# Patient Record
Sex: Female | Born: 1995 | Race: Black or African American | Hispanic: No | Marital: Single | Smoking: Current every day smoker
Health system: Southern US, Community
[De-identification: ages and names within clinical notes are randomized; demographics above are authoritative.]

---
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
## 2017-08-07 ENCOUNTER — Other Ambulatory Visit: Payer: Self-pay

## 2017-08-07 ENCOUNTER — Encounter: Payer: Self-pay | Admitting: Emergency Medicine

## 2017-08-07 ENCOUNTER — Emergency Department: Payer: Medicaid Other

## 2017-08-07 DIAGNOSIS — Y999 Unspecified external cause status: Secondary | ICD-10-CM | POA: Diagnosis not present

## 2017-08-07 DIAGNOSIS — S83004A Unspecified dislocation of right patella, initial encounter: Secondary | ICD-10-CM | POA: Insufficient documentation

## 2017-08-07 DIAGNOSIS — Y929 Unspecified place or not applicable: Secondary | ICD-10-CM | POA: Insufficient documentation

## 2017-08-07 DIAGNOSIS — X58XXXA Exposure to other specified factors, initial encounter: Secondary | ICD-10-CM | POA: Insufficient documentation

## 2017-08-07 DIAGNOSIS — Z79899 Other long term (current) drug therapy: Secondary | ICD-10-CM | POA: Diagnosis not present

## 2017-08-07 DIAGNOSIS — F17210 Nicotine dependence, cigarettes, uncomplicated: Secondary | ICD-10-CM | POA: Insufficient documentation

## 2017-08-07 DIAGNOSIS — Y9383 Activity, rough housing and horseplay: Secondary | ICD-10-CM | POA: Diagnosis not present

## 2017-08-07 DIAGNOSIS — S8991XA Unspecified injury of right lower leg, initial encounter: Secondary | ICD-10-CM | POA: Diagnosis present

## 2017-08-07 NOTE — ED Triage Notes (Signed)
Pt to triage via w/c, tearful; reports PTA had fallen on her right knee while horseplaying; c/o persistent pain radiating down leg; st felt is "pop out and back in"

## 2017-08-08 ENCOUNTER — Emergency Department
Admission: EM | Admit: 2017-08-08 | Discharge: 2017-08-08 | Disposition: A | Discharge status: Home / Self Care | Payer: Medicaid Other | Attending: Emergency Medicine | Admitting: Emergency Medicine

## 2017-08-08 DIAGNOSIS — S83004A Unspecified dislocation of right patella, initial encounter: Secondary | ICD-10-CM

## 2017-08-08 MED ORDER — HYDROCODONE-ACETAMINOPHEN 5-325 MG PO TABS
2.0000 | ORAL_TABLET | Freq: Once | ORAL | Status: AC
  Administered 2017-08-08: 2 via ORAL
  Filled 2017-08-08: qty 2

## 2017-08-08 MED ORDER — IBUPROFEN 600 MG PO TABS: 600.0000 mg | ORAL_TABLET | Freq: Three times a day (TID) | ORAL | 0 refills | Status: DC | PRN

## 2017-08-08 MED ORDER — HYDROCODONE-ACETAMINOPHEN 5-325 MG PO TABS: 1.0000 | ORAL_TABLET | Freq: Four times a day (QID) | ORAL | 0 refills | Status: DC | PRN

## 2017-08-08 MED ORDER — IBUPROFEN 600 MG PO TABS
600.0000 mg | ORAL_TABLET | Freq: Once | ORAL | Status: AC
  Administered 2017-08-08: 600 mg via ORAL
  Filled 2017-08-08: qty 1

## 2017-08-08 NOTE — Discharge Instructions (Signed)
Please wear your knee immobilizer at all times until you can follow-up with the orthopedic surgeon this coming week for recheck.  Return to the emergency department sooner for any concerns whatsoever.  It was a pleasure to take care of you today, and thank you for coming to our emergency department.  If you have any questions or concerns before leaving please ask the nurse to grab me and I'm more than happy to go through your aftercare instructions again.  If you were prescribed any opioid pain medication today such as Norco, Vicodin, Percocet, morphine, hydrocodone, or oxycodone please make sure you do not drive when you are taking this medication as it can alter your ability to drive safely.  If you have any concerns once you are home that you are not improving or are in fact getting worse before you can make it to your follow-up appointment, please do not hesitate to call 911 and come back for further evaluation.  Merrily Brittle, MD  Results for orders placed or performed during the hospital encounter of 04/06/16  Influenza panel by PCR (type A & B, H1N1)  Result Value Ref Range   Influenza A By PCR NEGATIVE NEGATIVE   Influenza B By PCR POSITIVE (A) NEGATIVE   Dg Knee Complete 4 Views Right  Result Date: 08/07/2017 CLINICAL DATA:  Patient fell on right knee while horse playing. Pain radiates down leg. Patient states that her knee feels like it pops in and out. EXAM: RIGHT KNEE - COMPLETE 4+ VIEW COMPARISON:  None. FINDINGS: Four views of the right knee were provided. On some of the views, there is slight lateral subluxation of the patella which could be due to a shallow trochlear groove which may predispose the patient patellar dislocation. No fracture nor joint effusion. No intra-articular loose bodies. IMPRESSION: Slight lateral subluxation of the patella on some of the views provided. This could be due to a shallow trochlear groove that may predispose the patient to patellar dislocation.  Currently there is no joint dislocation. No fracture nor joint effusion is seen. Nonemergent CT or MRI may help in the workup of this possibility. Electronically Signed   By: Tollie Eth M.D.   On: 08/07/2017 23:52   Unfortunately prescriptions medications can be very expensive.  Please consider [facility] as they have a number of medications that are $4 for a 30 day supply.  [email or web address].[email or web address]  Another great option is www.goodrx.com which can help you find the most affordable prices around you.

## 2017-08-08 NOTE — ED Notes (Signed)

## 2017-08-08 NOTE — ED Provider Notes (Signed)
[facility] Emergency Department Provider Note  ____________________________________________   First MD Initiated Contact with Patient 08/08/17 [phone]     (approximate)  I have reviewed the triage vital signs and the nursing notes.   HISTORY  Chief Complaint Knee Injury    HPI Miranda Lewis is a 22 y.o. female who self presents to the emergency department with right knee pain.  She said she was wrestling with her brother earlier today and she felt her knee "pop and then on pop".  She had sudden onset severe pain that is persisted.  She has had difficulty ambulating.  This is never happened before.  She denies numbness or weakness.  History reviewed. No pertinent past medical history.  There are no active problems to display for this patient.   History reviewed. No pertinent surgical history.  Prior to Admission medications   Medication Sig Start Date End Date Taking? Authorizing Provider  hydrOXYzine (ATARAX/VISTARIL) 10 MG tablet Take 1 tablet (10 mg total) by mouth every 6 (six) hours as needed for itching. 02/06/15   Tommi Rumps, PA-C  mupirocin ointment (BACTROBAN) 2 % Apply 1 application topically 2 (two) times daily. 10/02/15   Hagler, Jami L, PA-C  sulfamethoxazole-trimethoprim (BACTRIM DS,SEPTRA DS) 800-160 MG tablet Take 1 tablet by mouth 2 (two) times daily. 10/02/15   Hagler, Jami L, PA-C  Triamcinolone Acetonide (TRIAMCINOLONE 0.1 % CREAM : EUCERIN) CREA Apply 1 application topically 2 (two) times daily. 02/06/15   Tommi Rumps, PA-C    Allergies Patient has no known allergies.  No family history on file.  Social History Social History   Tobacco Use  . Smoking status: Current Every Day Smoker    Packs/day: 0.50    Types: Cigarettes  . Smokeless tobacco: Never Used  Substance Use Topics  . Alcohol use: No  . Drug use: No    Review of Systems Constitutional: No fever/chills ENT: No sore throat. Cardiovascular:  Denies chest pain. Respiratory: Denies shortness of breath. Gastrointestinal: No abdominal pain.  No nausea, no vomiting.  No diarrhea.  No constipation. Musculoskeletal: Positive for knee pain Neurological: Negative for headaches   ____________________________________________   PHYSICAL EXAM:  VITAL SIGNS: ED Triage Vitals  Enc Vitals Group     BP 08/07/17 2349 119/62     Pulse Rate 08/07/17 2349 94     Resp 08/07/17 2349 18     Temp 08/07/17 2349 98.4 F (36.9 C)     Temp Source 08/07/17 2349 Oral     SpO2 08/07/17 2349 100 %     Weight 08/07/17 2329 230 lb (104.3 kg)     Height 08/07/17 2329  (1.626 m)     Head Circumference --      Peak Flow --      Pain Score 08/07/17 2329 6     Pain Loc --      Pain Edu? --      Excl. in GC? --     Constitutional: Alert and oriented x4 appears obviously uncomfortable nontoxic no diaphoresis speaks in clear sentences Head: Atraumatic. Nose: No congestion/rhinnorhea. Mouth/Throat: No trismus Neck: No stridor.   Cardiovascular: Regular rate and rhythm Respiratory: Normal respiratory effort.  No retractions. MSK: Right knee stable extensor mechanism intact no effusion is somewhat tender patella is in place Neurologic:  Normal speech and language. No gross focal neurologic deficits are appreciated.  Skin:  Skin is warm, dry and intact. No rash noted.    ____________________________________________  LABS (all labs ordered are listed, but only abnormal results are displayed)  Labs Reviewed - No data to display   __________________________________________  EKG   ____________________________________________  RADIOLOGY  X-ray reviewed by me consistent with right patella subluxation ____________________________________________   DIFFERENTIAL includes but not limited to  Patella dislocation, knee dislocation, tibial plateau fracture   PROCEDURES  Procedure(s) performed: yes  SPLINT APPLICATION Authorized by:  Merrily Brittle Consent: Verbal consent obtained. Risks and benefits: risks, benefits and alternatives were discussed Consent given by: patient Splint applied by: ER nurse Location details: Right knee Splint type: Knee immobilizer Supplies used: Knee immobilizer Post-procedure: The splinted body part was neurovascularly unchanged following the procedure. Patient tolerance: Patient tolerated the procedure well with no immediate complications.     Procedures  Critical Care performed: no  Observation: no ____________________________________________   INITIAL IMPRESSION / ASSESSMENT AND PLAN / ED COURSE  Pertinent labs & imaging results that were available during my care of the patient were reviewed by me and considered in my medical decision making (see chart for details).  ----------------------------------------- 2:16 AM on 08/08/2017 -----------------------------------------  By the time I saw the patient she had already self reduced her dislocation.  Her x-rays negative for fracture dislocation at this time.  Placed in a knee immobilizer and is neurovascularly intact thereafter.  She will be given crutches and is toe-touch weightbearing.  Ortho referral.  Strict return precautions were given to the patient verbally understands and agrees with the plan.      ____________________________________________   FINAL CLINICAL IMPRESSION(S) / ED DIAGNOSES  Final diagnoses:  None      NEW MEDICATIONS STARTED DURING THIS VISIT:  New Prescriptions   No medications on file     Note:  This document was prepared using Dragon voice recognition software and may include unintentional dictation errors.      Merrily Brittle, MD 08/09/17 [phone]

## 2018-12-12 ENCOUNTER — Encounter: Payer: Self-pay | Admitting: Emergency Medicine

## 2018-12-12 ENCOUNTER — Emergency Department
Admission: EM | Admit: 2018-12-12 | Discharge: 2018-12-12 | Disposition: A | Discharge status: Home / Self Care | Payer: Medicaid Other | Attending: Emergency Medicine | Admitting: Emergency Medicine

## 2018-12-12 ENCOUNTER — Other Ambulatory Visit: Payer: Self-pay

## 2018-12-12 DIAGNOSIS — N3001 Acute cystitis with hematuria: Secondary | ICD-10-CM | POA: Insufficient documentation

## 2018-12-12 DIAGNOSIS — R110 Nausea: Secondary | ICD-10-CM

## 2018-12-12 DIAGNOSIS — Z79899 Other long term (current) drug therapy: Secondary | ICD-10-CM | POA: Insufficient documentation

## 2018-12-12 DIAGNOSIS — F17210 Nicotine dependence, cigarettes, uncomplicated: Secondary | ICD-10-CM | POA: Insufficient documentation

## 2018-12-12 LAB — LIPASE, BLOOD: Lipase: 22 U/L (ref 11–51)

## 2018-12-12 LAB — URINALYSIS, COMPLETE (UACMP) WITH MICROSCOPIC
Bilirubin Urine: NEGATIVE
Glucose, UA: NEGATIVE mg/dL
Ketones, ur: NEGATIVE mg/dL
Nitrite: NEGATIVE
Protein, ur: 30 mg/dL — AB
Specific Gravity, Urine: 1.028 (ref 1.005–1.030)
pH: 6 (ref 5.0–8.0)

## 2018-12-12 LAB — CBC
HCT: 35.2 % — ABNORMAL LOW (ref 36.0–46.0)
Hemoglobin: 10.8 g/dL — ABNORMAL LOW (ref 12.0–15.0)
MCH: 21.1 pg — ABNORMAL LOW (ref 26.0–34.0)
MCHC: 30.7 g/dL (ref 30.0–36.0)
MCV: 68.8 fL — ABNORMAL LOW (ref 80.0–100.0)
Platelets: 363 10*3/uL (ref 150–400)
RBC: 5.12 MIL/uL — ABNORMAL HIGH (ref 3.87–5.11)
RDW: 19.5 % — ABNORMAL HIGH (ref 11.5–15.5)
WBC: 13.5 10*3/uL — ABNORMAL HIGH (ref 4.0–10.5)
nRBC: 0 % (ref 0.0–0.2)

## 2018-12-12 LAB — COMPREHENSIVE METABOLIC PANEL
ALT: 15 U/L (ref 0–44)
AST: 41 U/L (ref 15–41)
Albumin: 4.3 g/dL (ref 3.5–5.0)
Alkaline Phosphatase: 89 U/L (ref 38–126)
Anion gap: 11 (ref 5–15)
BUN: 13 mg/dL (ref 6–20)
CO2: 25 mmol/L (ref 22–32)
Calcium: 9.7 mg/dL (ref 8.9–10.3)
Chloride: 104 mmol/L (ref 98–111)
Creatinine, Ser: 1.2 mg/dL — ABNORMAL HIGH (ref 0.44–1.00)
GFR calc Af Amer: >60 mL/min (ref >60)
GFR calc non Af Amer: >60 mL/min (ref >60)
Glucose, Bld: 81 mg/dL (ref 70–99)
Potassium: 3.7 mmol/L (ref 3.5–5.1)
Sodium: 140 mmol/L (ref 135–145)
Total Bilirubin: 0.3 mg/dL (ref 0.3–1.2)
Total Protein: 8 g/dL (ref 6.5–8.1)

## 2018-12-12 LAB — POCT PREGNANCY, URINE: Preg Test, Ur: NEGATIVE

## 2018-12-12 MED ORDER — SODIUM CHLORIDE 0.9 % IV SOLN
1.0000 g | Freq: Once | INTRAVENOUS | Status: AC
  Administered 2018-12-12: 21:00:00 1 g via INTRAVENOUS
  Filled 2018-12-12: qty 10

## 2018-12-12 MED ORDER — ONDANSETRON 4 MG PO TBDP
4.0000 mg | ORAL_TABLET | Freq: Once | ORAL | Status: AC
Start: 2018-12-12 — End: 2018-12-12
  Administered 2018-12-12: 21:00:00 4 mg via ORAL
  Filled 2018-12-12: qty 1

## 2018-12-12 MED ORDER — SODIUM CHLORIDE 0.9% FLUSH
3.0000 mL | Freq: Once | INTRAVENOUS | Status: AC
  Administered 2018-12-12: 22:00:00 3 mL via INTRAVENOUS

## 2018-12-12 MED ORDER — ONDANSETRON HCL 4 MG PO TABS: 4.0000 mg | ORAL_TABLET | Freq: Every day | ORAL | 0 refills | Status: AC | PRN

## 2018-12-12 MED ORDER — CEPHALEXIN 500 MG PO CAPS: 500.0000 mg | ORAL_CAPSULE | Freq: Three times a day (TID) | ORAL | 0 refills | Status: AC

## 2018-12-12 MED ORDER — CEFTRIAXONE SODIUM 1 G IJ SOLR
1.0000 g | Freq: Once | INTRAMUSCULAR | Status: DC
  Filled 2018-12-12: qty 10

## 2018-12-12 MED ORDER — SODIUM CHLORIDE 0.9 % IV BOLUS
1000.0000 mL | Freq: Once | INTRAVENOUS | Status: AC
Start: 2018-12-12 — End: 2018-12-12
  Administered 2018-12-12: 1000 mL via INTRAVENOUS

## 2018-12-12 NOTE — Discharge Instructions (Signed)
Your urine shows a developing urinary tract infection.  Please return to the emergency department if you develop fever, abdominal pain, worsening symptoms, any other symptoms concerning to you.  Please follow-up with primary care early next week.

## 2018-12-12 NOTE — ED Notes (Signed)
Patient states that she vomited times one this morning. Patient states that she continues to have waves of nausea throughout the day.

## 2018-12-12 NOTE — ED Notes (Signed)
Patient given a warm blanket at this time.  

## 2018-12-12 NOTE — ED Triage Notes (Signed)
Pt to ER with c/o nausea and vomiting that started this AM.  States only vomited early this AM, but nausea has continued throughout the day.  Pt denies abdominal pain, cough, congestion or other s/s.

## 2018-12-12 NOTE — ED Provider Notes (Signed)
[facility] Emergency Department Provider Note  ____________________________________________  Time seen: Approximately 8:27 PM  I have reviewed the triage vital signs and the nursing notes.   HISTORY  Chief Complaint Emesis and Nausea    HPI Miranda Lewis is a 23 y.o. female that presents to the emergency department for evaluation of nausea today.  Patient states that she felt nauseous this morning and proceeded to vomit once.  Patient noticed some blood in her urine today.  She should also be starting her menstrual cycle within the next couple of days.  She has been drinking [employer] today.  No recent illness. She feels well overall.  No fever or chills.  No congestion, cough, SOB, CP, abdominal pain, diarrhea, constipation.   History reviewed. No pertinent past medical history.  There are no active problems to display for this patient.   History reviewed. No pertinent surgical history.  Prior to Admission medications   Medication Sig Start Date End Date Taking? Authorizing Provider  cephALEXin (KEFLEX) 500 MG capsule Take 1 capsule (500 mg total) by mouth 3 (three) times daily for 7 days. 12/12/18 12/19/18  Laban Emperor, PA-C  HYDROcodone-acetaminophen (NORCO) 5-325 MG tablet Take 1 tablet by mouth every 6 (six) hours as needed for up to 7 doses for severe pain. 08/08/17   Darel Hong, MD  hydrOXYzine (ATARAX/VISTARIL) 10 MG tablet Take 1 tablet (10 mg total) by mouth every 6 (six) hours as needed for itching. 02/06/15   Johnn Hai, PA-C  ibuprofen (ADVIL,MOTRIN) 600 MG tablet Take 1 tablet (600 mg total) by mouth every 8 (eight) hours as needed. 08/08/17   Darel Hong, MD  mupirocin ointment (BACTROBAN) 2 % Apply 1 application topically 2 (two) times daily. 10/02/15   Hagler, Jami L, PA-C  ondansetron (ZOFRAN) 4 MG tablet Take 1 tablet (4 mg total) by mouth daily as needed for nausea or vomiting. 12/12/18 12/12/19  Laban Emperor, PA-C   sulfamethoxazole-trimethoprim (BACTRIM DS,SEPTRA DS) 800-160 MG tablet Take 1 tablet by mouth 2 (two) times daily. 10/02/15   Hagler, Jami L, PA-C  Triamcinolone Acetonide (TRIAMCINOLONE 0.1 % CREAM : EUCERIN) CREA Apply 1 application topically 2 (two) times daily. 02/06/15   Johnn Hai, PA-C    Allergies Patient has no known allergies.  No family history on file.  Social History Social History   Tobacco Use  . Smoking status: Current Every Day Smoker    Packs/day: 0.50    Types: Cigarettes  . Smokeless tobacco: Never Used  Substance Use Topics  . Alcohol use: No  . Drug use: No     Review of Systems  Constitutional: No fever/chills ENT: No upper respiratory complaints. Cardiovascular: No chest pain. Respiratory: No cough. No SOB. Gastrointestinal: No abdominal pain.  No nausea, no vomiting.  Genitourinary: Negative for dysuria, urgency, frequency. Musculoskeletal: Negative for musculoskeletal pain. Skin: Negative for rash, abrasions, lacerations, ecchymosis. Neurological: Negative for headaches, numbness or tingling   ____________________________________________   PHYSICAL EXAM:  VITAL SIGNS: ED Triage Vitals  Enc Vitals Group     BP 12/12/18 1805 (!) 105/57     Pulse Rate 12/12/18 1801 86     Resp 12/12/18 1801 18     Temp 12/12/18 1801 99.3 F (37.4 C)     Temp Source 12/12/18 1801 Oral     SpO2 12/12/18 1801 98 %     Weight 12/12/18 1801 231 lb (104.8 kg)     Height 12/12/18 1801 5\' 5"  (1.651 m)  Head Circumference --      Peak Flow --      Pain Score 12/12/18 1801 0     Pain Loc --      Pain Edu? --      Excl. in GC? --      Constitutional: Alert and oriented. Well appearing and in no acute distress. Eyes: Conjunctivae are normal. PERRL. EOMI. Head: Atraumatic. ENT:      Ears:      Nose: No congestion/rhinnorhea.      Mouth/Throat: Mucous membranes are moist.  Neck: No stridor.   Cardiovascular: Normal rate, regular rhythm.  Good  peripheral circulation. Respiratory: Normal respiratory effort without tachypnea or retractions. Lungs CTAB. Good air entry to the bases with no decreased or absent breath sounds. Gastrointestinal: Bowel sounds 4 quadrants. Soft and nontender to palpation. No guarding or rigidity. No palpable masses. No distention.  Musculoskeletal: Full range of motion to all extremities. No gross deformities appreciated. Neurologic:  Normal speech and language. No gross focal neurologic deficits are appreciated.  Skin:  Skin is warm, dry and intact. No rash noted. Psychiatric: Mood and affect are normal. Speech and behavior are normal. Patient exhibits appropriate insight and judgement.   ____________________________________________   LABS (all labs ordered are listed, but only abnormal results are displayed)  Labs Reviewed  COMPREHENSIVE METABOLIC PANEL - Abnormal; Notable for the following components:      Result Value   Creatinine, Ser 1.20 (*)    All other components within normal limits  CBC - Abnormal; Notable for the following components:   WBC 13.5 (*)    RBC 5.12 (*)    Hemoglobin 10.8 (*)    HCT 35.2 (*)    MCV 68.8 (*)    MCH 21.1 (*)    RDW 19.5 (*)    All other components within normal limits  URINALYSIS, COMPLETE (UACMP) WITH MICROSCOPIC - Abnormal; Notable for the following components:   Color, Urine YELLOW (*)    APPearance CLOUDY (*)    Hgb urine dipstick LARGE (*)    Protein, ur 30 (*)    Leukocytes,Ua SMALL (*)    Bacteria, UA RARE (*)    All other components within normal limits  URINE CULTURE  LIPASE, BLOOD  POC URINE PREG, ED  POCT PREGNANCY, URINE   ____________________________________________  EKG   ____________________________________________  RADIOLOGY   No results found.  ____________________________________________    PROCEDURES  Procedure(s) performed:    Procedures    Medications  sodium chloride flush (NS) 0.9 % injection 3 mL (3 mLs  Intravenous Given 12/12/18 2205)  sodium chloride 0.9 % bolus 1,000 mL (0 mLs Intravenous Stopped 12/12/18 2204)  ondansetron (ZOFRAN-ODT) disintegrating tablet 4 mg (4 mg Oral Given 12/12/18 2041)  cefTRIAXone (ROCEPHIN) 1 g in sodium chloride 0.9 % 100 mL IVPB (0 g Intravenous Stopped 12/12/18 2212)     ____________________________________________   INITIAL IMPRESSION / ASSESSMENT AND PLAN / ED COURSE  Pertinent labs & imaging results that were available during my care of the patient were reviewed by me and considered in my medical decision making (see chart for details).  Review of the [place] CSRS was performed in accordance of the [facility] prior to dispensing any controlled drugs.     Patient presents to the emergency department for evaluation of nausea for 1 day.  Vital signs and exam are reassuring.  White blood cell count elevated at 13.5.  Urinalysis shows bacteria, white blood cells, leukocytes, red blood cells and  hemoglobin, consistent with infection.  Patient denies any abdominal pain.  Patient was given IV ceftriaxone for infection and IV fluids.  Patient declines CT scan, and she is requesting to go home at this time.  Patient will be discharged home with prescriptions for Keflex. Patient is to follow up with primary care as directed. Patient is given ED precautions to return to the ED for any worsening or new symptoms.   Miranda Lewis was evaluated in Emergency Department on 12/12/2018 for the symptoms described in the history of present illness. She was evaluated in the context of the global COVID-19 pandemic, which necessitated consideration that the patient might be at risk for infection with the SARS-CoV-2 virus that causes COVID-19. Institutional protocols and algorithms that pertain to the evaluation of patients at risk for COVID-19 are in a state of rapid change based on information released by regulatory bodies including the CDC and federal and state organizations. These policies and  algorithms were followed during the patient's care in the ED.  ____________________________________________  FINAL CLINICAL IMPRESSION(S) / ED DIAGNOSES  Final diagnoses:  Nausea  Acute cystitis with hematuria      NEW MEDICATIONS STARTED DURING THIS VISIT:  ED Discharge Orders         Ordered    cephALEXin (KEFLEX) 500 MG capsule  3 times daily     12/12/18 2201    ondansetron (ZOFRAN) 4 MG tablet  Daily PRN     12/12/18 2201              This chart was dictated using voice recognition software/Dragon. Despite best efforts to proofread, errors can occur which can change the meaning. Any change was purely unintentional.    Enid Derry, PA-C 12/12/18 2355    Minna Antis, MD 12/16/18 [phone]

## 2018-12-14 LAB — URINE CULTURE: Culture: >=100000 — AB

## 2019-03-28 IMAGING — CR DG KNEE COMPLETE 4+V*R*
4 series · 4 of 4 positions shown · non-contrast
Comparison: None.

CLINICAL DATA: Patient fell on right knee while horse playing. Pain
radiates down leg. Patient states that her knee feels like it pops
in and out.

EXAM:
RIGHT KNEE - COMPLETE 4+ VIEW

[knee ap]
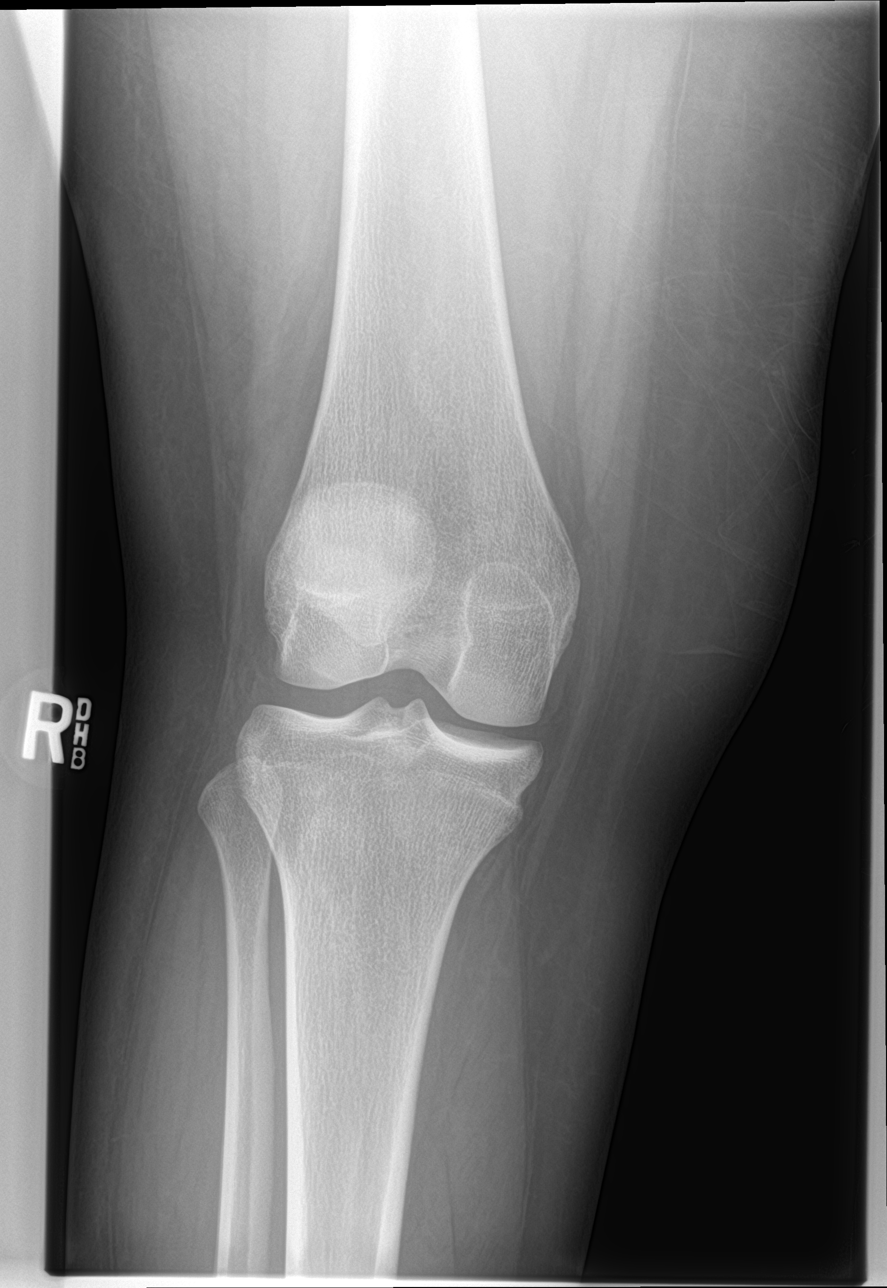

[knee obl (1 of 2)]
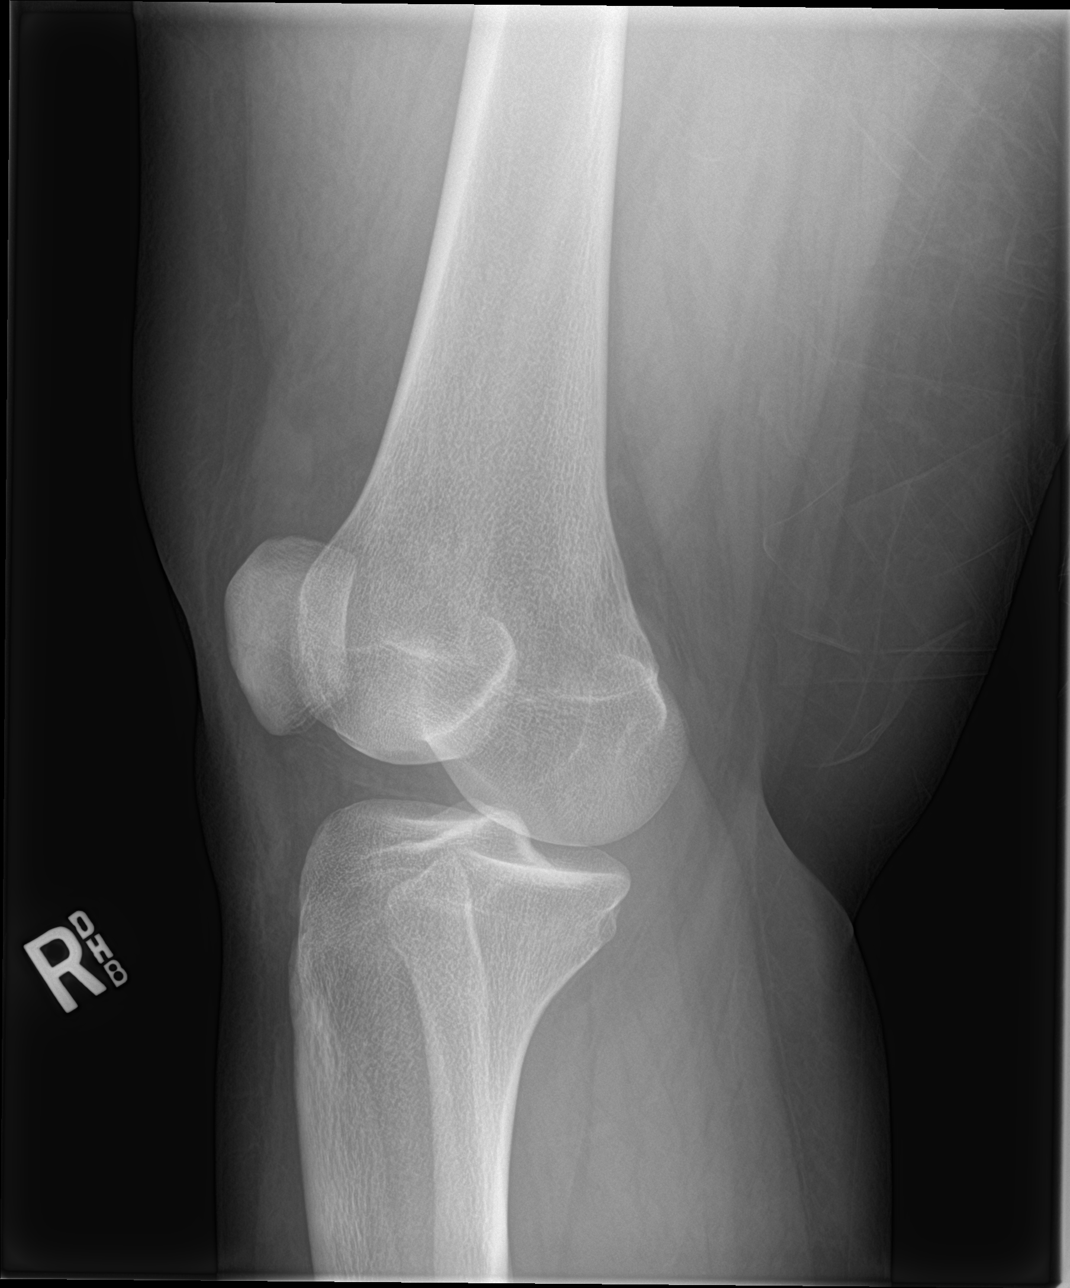

[knee obl (2 of 2)]
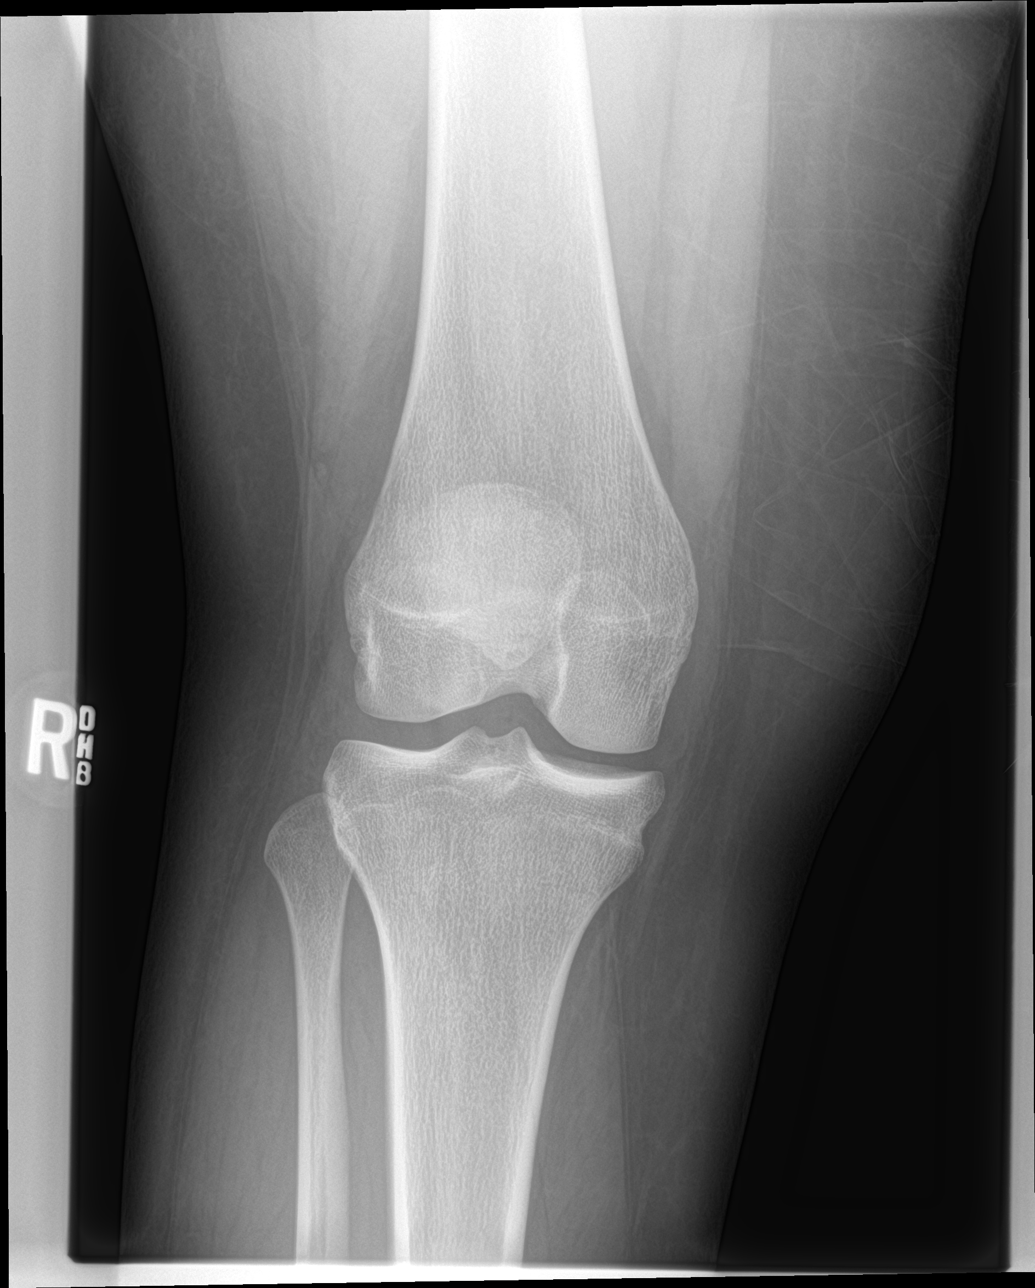

[knee lat]
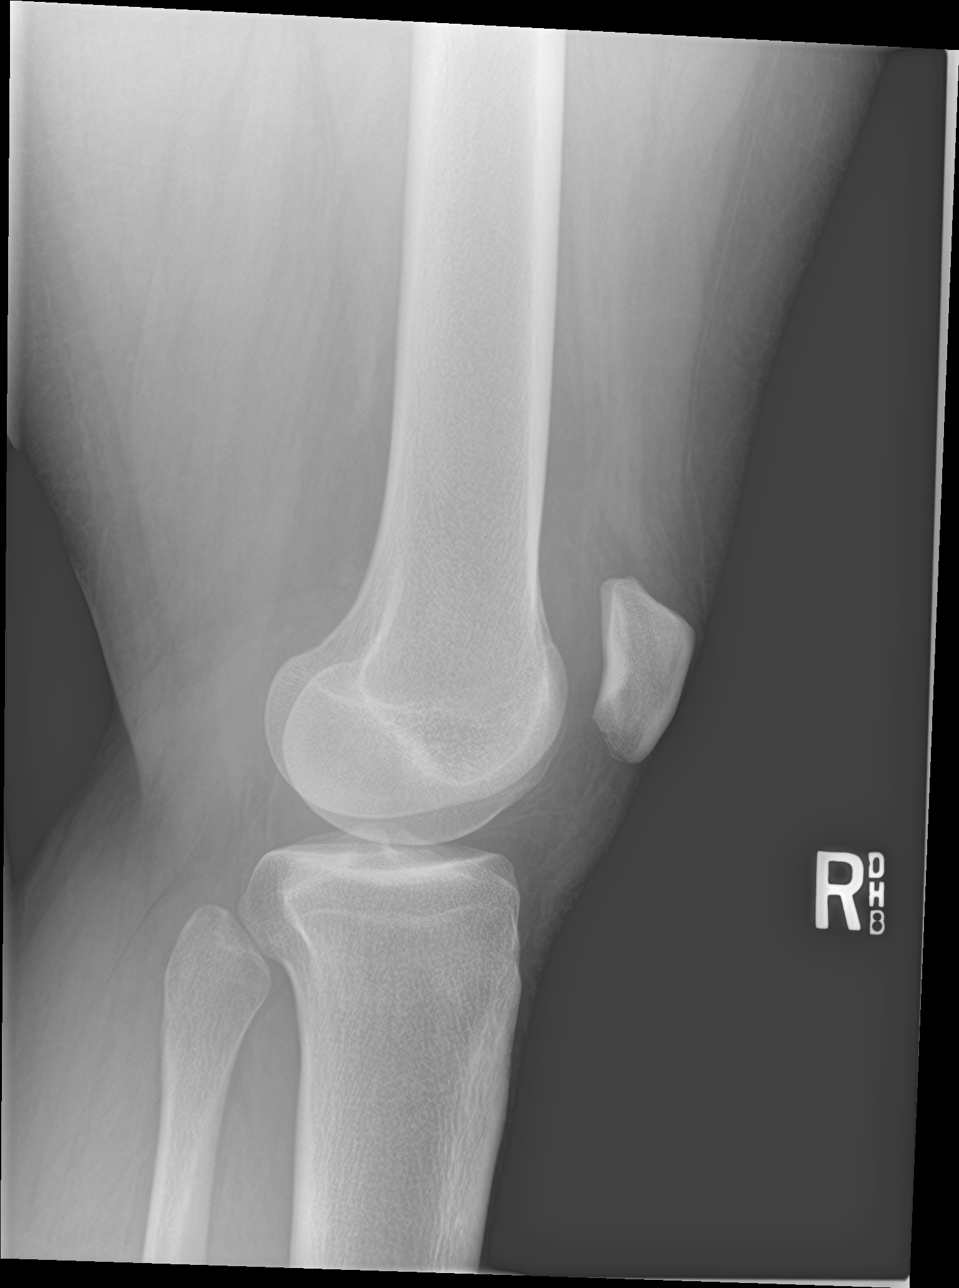

[4 of 4 positions shown; findings below may reference images not displayed]

FINDINGS: Four views of the right knee were provided. On some of the views,
there is slight lateral subluxation of the patella which could be
due to a shallow trochlear groove which may predispose the patient
patellar dislocation. No fracture nor joint effusion. No
intra-articular loose bodies.
IMPRESSION: Slight lateral subluxation of the patella on some of the views
provided. This could be due to a shallow trochlear groove that may
predispose the patient to patellar dislocation. Currently there is
no joint dislocation. No fracture nor joint effusion is seen.
Nonemergent CT or MRI may help in the workup of this possibility.

## 2020-04-18 ENCOUNTER — Ambulatory Visit
Admission: EM | Admit: 2020-04-18 | Discharge: 2020-04-18 | Disposition: A | Discharge status: Home / Self Care | Payer: HRSA Program | Attending: Family Medicine | Admitting: Family Medicine

## 2020-04-18 ENCOUNTER — Other Ambulatory Visit: Payer: Self-pay

## 2020-04-18 DIAGNOSIS — F17210 Nicotine dependence, cigarettes, uncomplicated: Secondary | ICD-10-CM | POA: Diagnosis not present

## 2020-04-18 DIAGNOSIS — J069 Acute upper respiratory infection, unspecified: Secondary | ICD-10-CM | POA: Diagnosis not present

## 2020-04-18 DIAGNOSIS — U071 COVID-19: Secondary | ICD-10-CM | POA: Insufficient documentation

## 2020-04-18 DIAGNOSIS — R059 Cough, unspecified: Secondary | ICD-10-CM | POA: Diagnosis present

## 2020-04-18 MED ORDER — BENZONATATE 200 MG PO CAPS: 200.0000 mg | ORAL_CAPSULE | Freq: Three times a day (TID) | ORAL | 0 refills | Status: AC | PRN

## 2020-04-18 MED ORDER — CETIRIZINE-PSEUDOEPHEDRINE ER 5-120 MG PO TB12: 1.0000 | ORAL_TABLET | Freq: Two times a day (BID) | ORAL | 0 refills | Status: AC

## 2020-04-18 NOTE — Discharge Instructions (Signed)
Medication as prescribed.  Stay home.  Check my chart for COVID test results.  Take care  Dr. Taela Charbonneau   

## 2020-04-18 NOTE — ED Provider Notes (Signed)
[facility]    CSN: [id number] Arrival date & time: 04/18/20  1155      History   Chief Complaint Chief Complaint  Patient presents with  . Cough   HPI  25 year old female presents with symptoms.  Patient reports a 3-day history of headache, congestion, cough.  She reports hot and cold spells.  Has not taken her temperature.  No known sick contacts.  No relieving factors.  No other associated symptoms.  No other complaints.   Home Medications    Prior to Admission medications   Medication Sig Start Date End Date Taking? Authorizing Provider  benzonatate (TESSALON) 200 MG capsule Take 1 capsule (200 mg total) by mouth 3 (three) times daily as needed for cough. 04/18/20  Yes Matheau Orona G, DO  cetirizine-pseudoephedrine (ZYRTEC-D) 5-120 MG tablet Take 1 tablet by mouth 2 (two) times daily. 04/18/20  Yes Tommie Sams, DO    Family History Family History  Problem Relation Age of Onset  . Healthy Mother   . Diabetes Father     Social History Social History   Tobacco Use  . Smoking status: Current Every Day Smoker    Packs/day: 0.50    Types: Cigarettes  . Smokeless tobacco: Never Used  Substance Use Topics  . Alcohol use: Yes  . Drug use: No     Allergies   Patient has no known allergies.   Review of Systems Review of Systems  Constitutional: Positive for chills.  HENT: Positive for congestion.   Respiratory: Positive for cough.    Physical Exam Triage Vital Signs ED Triage Vitals  Enc Vitals Group     BP 04/18/20 1308 109/88     Pulse Rate 04/18/20 1308 (!) 109     Resp 04/18/20 1308 18     Temp 04/18/20 1308 98.7 F (37.1 C)     Temp Source 04/18/20 1308 Oral     SpO2 04/18/20 1308 99 %     Weight 04/18/20 1305 211 lb (95.7 kg)     Height 04/18/20 1305 5\' 4"  (1.626 m)     Head Circumference --      Peak Flow --      Pain Score 04/18/20 1305 1     Pain Loc --      Pain Edu? --      Excl. in GC? --     Updated Vital  Signs BP 109/88 (BP Location: Left Arm)   Pulse (!) 109   Temp 98.7 F (37.1 C) (Oral)   Resp 18   Ht 5\' 4"  (1.626 m)   Wt 95.7 kg   LMP 03/22/2020   SpO2 99%   BMI 36.22 kg/m   Visual Acuity Right Eye Distance:   Left Eye Distance:   Bilateral Distance:    Right Eye Near:   Left Eye Near:    Bilateral Near:     Physical Exam Vitals and nursing note reviewed.  Constitutional:      General: She is not in acute distress.    Appearance: Normal appearance. She is not ill-appearing.  HENT:     Head: Normocephalic.  Eyes:     General:        Right eye: No discharge.        Left eye: No discharge.     Conjunctiva/sclera: Conjunctivae normal.  Cardiovascular:     Rate and Rhythm: Regular rhythm. Tachycardia present.  Pulmonary:     Effort: Pulmonary effort is normal.  Breath sounds: Normal breath sounds. No wheezing, rhonchi or rales.  Neurological:     Mental Status: She is alert.  Psychiatric:        Mood and Affect: Mood normal.        Behavior: Behavior normal.    UC Treatments / Results  Labs (all labs ordered are listed, but only abnormal results are displayed) Labs Reviewed  SARS CORONAVIRUS 2 (TAT 6-24 HRS)    EKG   Radiology No results found.  Procedures Procedures (including critical care time)  Medications Ordered in UC Medications - No data to display  Initial Impression / Assessment and Plan / UC Course  I have reviewed the triage vital signs and the nursing notes.  Pertinent labs & imaging results that were available during my care of the patient were reviewed by me and considered in my medical decision making (see chart for details).    25 year old female presents with viral URI with cough.  Possible COVID-19.  Awaiting test results.  Treating with Zyrtec-D and Tessalon Perles.  Final Clinical Impressions(s) / UC Diagnoses   Final diagnoses:  Viral URI with cough     Discharge Instructions     Medication as  prescribed.  Stay home.  Check my chart for COVID test results.  Take care  Dr. Adriana Simas     ED Prescriptions    Medication Sig Dispense Auth. Provider   cetirizine-pseudoephedrine (ZYRTEC-D) 5-120 MG tablet Take 1 tablet by mouth 2 (two) times daily. 30 tablet Marquice Uddin G, DO   benzonatate (TESSALON) 200 MG capsule Take 1 capsule (200 mg total) by mouth 3 (three) times daily as needed for cough. 30 capsule Tommie Sams, DO     PDMP not reviewed this encounter.   Tommie Sams, DO 04/18/20 1435

## 2020-04-18 NOTE — ED Triage Notes (Signed)
Patient complains of cough, congestion, chills and sweats x 3 days.

## 2020-04-19 LAB — SARS CORONAVIRUS 2 (TAT 6-24 HRS): SARS Coronavirus 2: POSITIVE — AB

## 2021-02-10 ENCOUNTER — Encounter: Payer: Self-pay | Admitting: Physician Assistant

## 2021-02-10 ENCOUNTER — Other Ambulatory Visit: Payer: Self-pay

## 2021-02-10 ENCOUNTER — Emergency Department
Admission: EM | Admit: 2021-02-10 | Discharge: 2021-02-10 | Disposition: A | Discharge status: Home / Self Care | Payer: Self-pay | Attending: Emergency Medicine | Admitting: Emergency Medicine

## 2021-02-10 DIAGNOSIS — F17210 Nicotine dependence, cigarettes, uncomplicated: Secondary | ICD-10-CM | POA: Insufficient documentation

## 2021-02-10 DIAGNOSIS — L02411 Cutaneous abscess of right axilla: Secondary | ICD-10-CM | POA: Insufficient documentation

## 2021-02-10 DIAGNOSIS — R059 Cough, unspecified: Secondary | ICD-10-CM | POA: Insufficient documentation

## 2021-02-10 MED ORDER — LIDOCAINE HCL (PF) 1 % IJ SOLN
5.0000 mL | Freq: Once | INTRAMUSCULAR | Status: AC
  Administered 2021-02-10: 5 mL
  Filled 2021-02-10: qty 5

## 2021-02-10 MED ORDER — HYDROCODONE-ACETAMINOPHEN 5-325 MG PO TABS
1.0000 | ORAL_TABLET | Freq: Once | ORAL | Status: AC
Start: 2021-02-10 — End: 2021-02-10
  Administered 2021-02-10: 1 via ORAL
  Filled 2021-02-10: qty 1

## 2021-02-10 MED ORDER — SULFAMETHOXAZOLE-TRIMETHOPRIM 800-160 MG PO TABS: 1.0000 | ORAL_TABLET | Freq: Two times a day (BID) | ORAL | 0 refills | Status: DC

## 2021-02-10 MED ORDER — BUPIVACAINE HCL (PF) 0.5 % IJ SOLN
10.0000 mL | Freq: Once | INTRAMUSCULAR | Status: AC
  Administered 2021-02-10: 10 mL
  Filled 2021-02-10: qty 10

## 2021-02-10 MED ORDER — HYDROCODONE-ACETAMINOPHEN 5-325 MG PO TABS: 1.0000 | ORAL_TABLET | Freq: Three times a day (TID) | ORAL | 0 refills | Status: DC | PRN

## 2021-02-10 MED ORDER — SULFAMETHOXAZOLE-TRIMETHOPRIM 800-160 MG PO TABS
1.0000 | ORAL_TABLET | Freq: Once | ORAL | Status: AC
  Administered 2021-02-10: 1 via ORAL
  Filled 2021-02-10: qty 1

## 2021-02-10 NOTE — ED Provider Notes (Signed)
[facility] Emergency Department Provider Note ____________________________________________  Time seen: 49  I have reviewed the triage vital signs and the nursing notes.  HISTORY  Chief Complaint  Abscess   HPI Miranda Lewis is a 25 y.o. female presents to the ED with complaints of a tender abscess on the right axilla.  She reports symptoms of a cough for the last several weeks.  She denies any spontaneous drainage, fevers or chills.  She denies ever having an I&D procedure performed on abscesses, which she reports normally resolve spontaneously.  History reviewed. No pertinent past medical history.  There are no problems to display for this patient.   History reviewed. No pertinent surgical history.  Prior to Admission medications   Medication Sig Start Date End Date Taking? Authorizing Provider  HYDROcodone-acetaminophen (NORCO) 5-325 MG tablet Take 1 tablet by mouth 3 (three) times daily as needed for up to 3 days. 02/10/21 02/13/21 Yes Kaelyn Nauta, Charlesetta Ivory, PA-C  sulfamethoxazole-trimethoprim (BACTRIM DS) 800-160 MG tablet Take 1 tablet by mouth 2 (two) times daily for 10 days. 02/10/21 02/20/21 Yes Sidrah Harden, Charlesetta Ivory, PA-C  benzonatate (TESSALON) 200 MG capsule Take 1 capsule (200 mg total) by mouth 3 (three) times daily as needed for cough. 04/18/20   Tommie Sams, DO  cetirizine-pseudoephedrine (ZYRTEC-D) 5-120 MG tablet Take 1 tablet by mouth 2 (two) times daily. 04/18/20   Tommie Sams, DO    Allergies Patient has no known allergies.  Family History  Problem Relation Age of Onset   Healthy Mother    Diabetes Father     Social History Social History   Tobacco Use   Smoking status: Every Day    Packs/day: 0.50    Types: Cigarettes   Smokeless tobacco: Never  Substance Use Topics   Alcohol use: Yes   Drug use: No    Review of Systems  Constitutional: Negative for fever. Eyes: Negative for visual changes. ENT: Negative  for sore throat. Cardiovascular: Negative for chest pain. Respiratory: Negative for shortness of breath. Gastrointestinal: Negative for abdominal pain, vomiting and diarrhea. Genitourinary: Negative for dysuria. Musculoskeletal: Negative for back pain. Skin: Negative for rash.  Right axilla abscess as above. Neurological: Negative for headaches, focal weakness or numbness. ____________________________________________  PHYSICAL EXAM:  VITAL SIGNS: ED Triage Vitals  Enc Vitals Group     BP 02/10/21 1710 (!) 144/67     Pulse Rate 02/10/21 1710 97     Resp 02/10/21 1710 18     Temp 02/10/21 1710 99.1 F (37.3 C)     Temp src --      SpO2 02/10/21 1710 100 %     Weight 02/10/21 1819 210 lb 15.7 oz (95.7 kg)     Height 02/10/21 1819 5\' 4"  (1.626 m)     Head Circumference --      Peak Flow --      Pain Score --      Pain Loc --      Pain Edu? --      Excl. in GC? --     Constitutional: Alert and oriented. Well appearing and in no distress. Head: Normocephalic and atraumatic. Eyes: Conjunctivae are normal.  Normal extraocular movements Cardiovascular: Normal rate, regular rhythm. Normal distal pulses. Respiratory: Normal respiratory effort. No wheezes/rales/rhonchi. Gastrointestinal: Soft and nontender. No distention. Musculoskeletal: Nontender with normal range of motion in all extremities.  Neurologic:  Normal gait without ataxia. Normal speech and language. No gross focal neurologic deficits are  appreciated. Skin:  Skin is warm, dry and intact. No rash noted.  Right axilla with a 2 cm pointing cystic lesion in the subcu space.  No central punctum is appreciated.  No spontaneous drainage is noted.  Surrounding induration is a cruciated. ____________________________________________    {LABS (pertinent positives/negatives)  ____________________________________________  {EKG  ____________________________________________   RADIOLOGY Official radiology report(s): No results  found. ____________________________________________  PROCEDURES  Bactrim DS 1 PO Norco 5-325 mg PO  .Marland KitchenIncision and Drainage  Date/Time: 02/10/2021 6:55 PM Performed by: Lissa Hoard, PA-C Authorized by: Lissa Hoard, PA-C   Consent:    Consent obtained:  Verbal   Consent given by:  Patient   Risks, benefits, and alternatives were discussed: yes     Risks discussed:  Incomplete drainage and pain   Alternatives discussed:  Alternative treatment Universal protocol:    Site/side marked: yes     Patient identity confirmed:  Verbally with patient Location:    Type:  Abscess   Size:  3   Location:  Upper extremity Pre-procedure details:    Skin preparation:  Povidone-iodine Sedation:    Sedation type:  None Anesthesia:    Anesthesia method:  Local infiltration   Local anesthetic:  Lidocaine 1% w/o epi and bupivacaine 0.5% w/o epi Procedure type:    Complexity:  Simple Procedure details:    Ultrasound guidance: no     Needle aspiration: no     Incision types:  Single straight   Incision depth:  Subcutaneous   Wound management:  Probed and deloculated and irrigated with saline   Drainage:  Purulent   Drainage amount:  Moderate   Wound treatment:  Wound left open   Packing materials:  1/4 in iodoform gauze Post-procedure details:    Procedure completion:  Tolerated well, no immediate complications ____________________________________________   INITIAL IMPRESSION / ASSESSMENT AND PLAN / ED COURSE  As part of my medical decision making, I reviewed the following data within the electronic MEDICAL RECORD NUMBER Notes from prior ED visits and [place] Controlled Substance Database   Patient with ED evaluation of an abscess to the right axilla. She consents to an I&D procedure, which successfully drains her abscess. She is discharged with antibiotics and pain medicine. Return precautions have been reviewed.   Miranda Lewis was evaluated in Emergency Department  on 02/10/2021 for the symptoms described in the history of present illness. She was evaluated in the context of the global COVID-19 pandemic, which necessitated consideration that the patient might be at risk for infection with the SARS-CoV-2 virus that causes COVID-19. Institutional protocols and algorithms that pertain to the evaluation of patients at risk for COVID-19 are in a state of rapid change based on information released by regulatory bodies including the CDC and federal and state organizations. These policies and algorithms were followed during the patient's care in the ED. ____________________________________________  FINAL CLINICAL IMPRESSION(S) / ED DIAGNOSES  Final diagnoses:  Abscess of axilla, right      Karmen Stabs, Charlesetta Ivory, PA-C 02/10/21 2018    Phineas Semen, MD 02/10/21 2108

## 2021-02-10 NOTE — Discharge Instructions (Addendum)
Take the antibiotic as directed. Take the pain medicine as needed. Apply warm compresses to promote healing. Remove the packing in 3 days or return to the ED for wound check.

## 2021-02-10 NOTE — ED Triage Notes (Addendum)
Pt come with c/o abscess under right arm. Pt states this has been on going for weeks. Pt states fever and congestion. No drainage per pt.

## 2021-02-13 ENCOUNTER — Other Ambulatory Visit: Payer: Self-pay

## 2021-02-13 ENCOUNTER — Emergency Department: Payer: Self-pay

## 2021-02-13 ENCOUNTER — Emergency Department
Admission: EM | Admit: 2021-02-13 | Discharge: 2021-02-13 | Disposition: A | Discharge status: Home / Self Care | Payer: Self-pay | Attending: Emergency Medicine | Admitting: Emergency Medicine

## 2021-02-13 DIAGNOSIS — L03111 Cellulitis of right axilla: Secondary | ICD-10-CM | POA: Insufficient documentation

## 2021-02-13 DIAGNOSIS — J40 Bronchitis, not specified as acute or chronic: Secondary | ICD-10-CM

## 2021-02-13 DIAGNOSIS — L03119 Cellulitis of unspecified part of limb: Secondary | ICD-10-CM

## 2021-02-13 DIAGNOSIS — Z20822 Contact with and (suspected) exposure to covid-19: Secondary | ICD-10-CM | POA: Insufficient documentation

## 2021-02-13 DIAGNOSIS — F17210 Nicotine dependence, cigarettes, uncomplicated: Secondary | ICD-10-CM | POA: Insufficient documentation

## 2021-02-13 DIAGNOSIS — L02419 Cutaneous abscess of limb, unspecified: Secondary | ICD-10-CM

## 2021-02-13 DIAGNOSIS — R059 Cough, unspecified: Secondary | ICD-10-CM | POA: Insufficient documentation

## 2021-02-13 DIAGNOSIS — L02411 Cutaneous abscess of right axilla: Secondary | ICD-10-CM | POA: Insufficient documentation

## 2021-02-13 LAB — HCG, QUANTITATIVE, PREGNANCY: hCG, Beta Chain, Quant, S: <1 m[IU]/mL (ref <5)

## 2021-02-13 LAB — BASIC METABOLIC PANEL
Anion gap: 11 (ref 5–15)
BUN: 14 mg/dL (ref 6–20)
CO2: 19 mmol/L — ABNORMAL LOW (ref 22–32)
Calcium: 9.4 mg/dL (ref 8.9–10.3)
Chloride: 104 mmol/L (ref 98–111)
Creatinine, Ser: 1.13 mg/dL — ABNORMAL HIGH (ref 0.44–1.00)
GFR, Estimated: >60 mL/min (ref >60)
Glucose, Bld: 135 mg/dL — ABNORMAL HIGH (ref 70–99)
Potassium: 3.9 mmol/L (ref 3.5–5.1)
Sodium: 134 mmol/L — ABNORMAL LOW (ref 135–145)

## 2021-02-13 LAB — CBC
HCT: 41.3 % (ref 36.0–46.0)
Hemoglobin: 13.4 g/dL (ref 12.0–15.0)
MCH: 24.1 pg — ABNORMAL LOW (ref 26.0–34.0)
MCHC: 32.4 g/dL (ref 30.0–36.0)
MCV: 74.1 fL — ABNORMAL LOW (ref 80.0–100.0)
Platelets: 297 10*3/uL (ref 150–400)
RBC: 5.57 MIL/uL — ABNORMAL HIGH (ref 3.87–5.11)
RDW: 15 % (ref 11.5–15.5)
WBC: 7.8 10*3/uL (ref 4.0–10.5)
nRBC: 0 % (ref 0.0–0.2)

## 2021-02-13 LAB — RESP PANEL BY RT-PCR (FLU A&B, COVID) ARPGX2
Influenza A by PCR: POSITIVE — AB
Influenza B by PCR: NEGATIVE
SARS Coronavirus 2 by RT PCR: NEGATIVE

## 2021-02-13 MED ORDER — OXYCODONE-ACETAMINOPHEN 5-325 MG PO TABS
1.0000 | ORAL_TABLET | Freq: Once | ORAL | Status: AC
  Administered 2021-02-13: 1 via ORAL
  Filled 2021-02-13: qty 1

## 2021-02-13 MED ORDER — HYDROCODONE-ACETAMINOPHEN 5-325 MG PO TABS: 1.0000 | ORAL_TABLET | Freq: Three times a day (TID) | ORAL | 0 refills | Status: AC | PRN

## 2021-02-13 MED ORDER — DOXYCYCLINE HYCLATE 100 MG PO CAPS: 100.0000 mg | ORAL_CAPSULE | Freq: Two times a day (BID) | ORAL | 0 refills | Status: AC

## 2021-02-13 MED ORDER — IBUPROFEN 400 MG PO TABS
400.0000 mg | ORAL_TABLET | Freq: Once | ORAL | Status: AC
  Administered 2021-02-13: 400 mg via ORAL
  Filled 2021-02-13: qty 1

## 2021-02-13 NOTE — ED Provider Notes (Signed)
[facility] Emergency Department Provider Note  ____________________________________________   Event Date/Time   First MD Initiated Contact with Patient 02/13/21 1038     (approximate)  I have reviewed the triage vital signs and the nursing notes.   HISTORY  Chief Complaint Abscess   HPI Miranda Lewis is a 25 y.o. female without significant past medical history aside from recent diagnosis of an abscess and cellulitis in the right axilla on 1114/4 discharged with prescription for Bactrim and hydrocodone for pain control with wound having been I indeed and packed in the emergency room as well as recent diagnosis of bronchitis who presents for assessment of some persistent cough and pain in her axilla.  Patient states she has had some subjective fevers and chills but has not had any significant chest pain, shortness of breath, abdominal pain, vomiting, diarrhea, burning with urination, rash or any other areas of redness other than some persistent redness in the right axilla.  She states that she has not had a chance to remove the packing and that the dressing placed in the ED on 11/4 still in place.  She has not been taking any NSAIDs but has been taking hydrocodone which she feels only minimally helping her discomfort.  No other acute concerns at this time.         History reviewed. No pertinent past medical history.  There are no problems to display for this patient.   History reviewed. No pertinent surgical history.  Prior to Admission medications   Medication Sig Start Date End Date Taking? Authorizing Provider  doxycycline (VIBRAMYCIN) 100 MG capsule Take 1 capsule (100 mg total) by mouth 2 (two) times daily for 7 days. 02/13/21 02/20/21 Yes Gilles Chiquito, MD  benzonatate (TESSALON) 200 MG capsule Take 1 capsule (200 mg total) by mouth 3 (three) times daily as needed for cough. 04/18/20   Tommie Sams, DO  cetirizine-pseudoephedrine (ZYRTEC-D)  5-120 MG tablet Take 1 tablet by mouth 2 (two) times daily. 04/18/20   Tommie Sams, DO  HYDROcodone-acetaminophen (NORCO) 5-325 MG tablet Take 1 tablet by mouth 3 (three) times daily as needed for up to 3 days. 02/13/21 02/16/21  Gilles Chiquito, MD    Allergies Patient has no known allergies.  Family History  Problem Relation Age of Onset   Healthy Mother    Diabetes Father     Social History Social History   Tobacco Use   Smoking status: Every Day    Packs/day: 0.50    Types: Cigarettes   Smokeless tobacco: Never  Substance Use Topics   Alcohol use: Yes   Drug use: No    Review of Systems  Review of Systems  Constitutional:  Positive for fever (subjective). Negative for chills.  HENT:  Positive for congestion. Negative for sore throat.   Eyes:  Negative for pain.  Respiratory:  Positive for cough. Negative for stridor.   Cardiovascular:  Negative for chest pain.  Gastrointestinal:  Negative for diarrhea and vomiting.  Genitourinary:  Negative for dysuria.  Musculoskeletal:  Positive for myalgias (R axillary region).  Skin:  Negative for rash.  Neurological:  Negative for seizures, loss of consciousness and headaches.  Psychiatric/Behavioral:  Negative for suicidal ideas.   All other systems reviewed and are negative.    ____________________________________________   PHYSICAL EXAM:  VITAL SIGNS: ED Triage Vitals  Enc Vitals Group     BP 02/13/21 0946 136/90     Pulse Rate 02/13/21 0946 Marland Kitchen)  122     Resp 02/13/21 0946 18     Temp 02/13/21 0946 99 F (37.2 C)     Temp Source 02/13/21 0946 Oral     SpO2 02/13/21 0946 98 %     Weight 02/13/21 0945 220 lb (99.8 kg)     Height 02/13/21 0945 5\' 4"  (1.626 m)     Head Circumference --      Peak Flow --      Pain Score 02/13/21 0944 8     Pain Loc --      Pain Edu? --      Excl. in GC? --    Vitals:   02/13/21 0946 02/13/21 0947  BP: 136/90 136/90  Pulse: (!) 122 (!) 114  Resp: 18 18  Temp: 99 F (37.2  C) 99 F (37.2 C)  SpO2: 98% 98%   Physical Exam Vitals and nursing note reviewed.  Constitutional:      General: She is not in acute distress.    Appearance: She is well-developed.  HENT:     Head: Normocephalic and atraumatic.     Right Ear: External ear normal.     Left Ear: External ear normal.     Nose: Nose normal.  Eyes:     Conjunctiva/sclera: Conjunctivae normal.  Cardiovascular:     Rate and Rhythm: Normal rate and regular rhythm.     Heart sounds: No murmur heard. Pulmonary:     Effort: Pulmonary effort is normal. No respiratory distress.     Breath sounds: Normal breath sounds.  Abdominal:     Palpations: Abdomen is soft.     Tenderness: There is no abdominal tenderness.  Musculoskeletal:     Cervical back: Neck supple.  Skin:    General: Skin is warm and dry.  Neurological:     Mental Status: She is alert and oriented to person, place, and time.  Psychiatric:        Mood and Affect: Mood normal.    Is unremarkable.  Patient's right axilla has incision site visible with some purulent drainage and packing in place.  There is surrounding erythema induration tenderness and warmth.  2+ radial pulses.  Sensation intact light touch of the bilateral lower extremities.  Patient is symmetric object on arrival in upper extremities. ____________________________________________   LABS (all labs ordered are listed, but only abnormal results are displayed)  Labs Reviewed  CBC - Abnormal; Notable for the following components:      Result Value   RBC 5.57 (*)    MCV 74.1 (*)    MCH 24.1 (*)    All other components within normal limits  BASIC METABOLIC PANEL - Abnormal; Notable for the following components:   Sodium 134 (*)    CO2 19 (*)    Glucose, Bld 135 (*)    Creatinine, Ser 1.13 (*)    All other components within normal limits  RESP PANEL BY RT-PCR (FLU A&B, COVID) ARPGX2  HCG, QUANTITATIVE, PREGNANCY    ____________________________________________  EKG  ____________________________________________  RADIOLOGY  ED MD interpretation: Chest x-ray has no focal consolidations, effusion, edema, pneumothorax or any other clear acute intrathoracic process.  Official radiology report(s): DG Chest 2 View  Result Date: 02/13/2021 CLINICAL DATA:  Cough, fever and sweats. EXAM: CHEST - 2 VIEW COMPARISON:  04/06/2016. FINDINGS: Trachea is midline. Heart size normal. Lungs are clear. No pleural fluid. IMPRESSION: Negative. Electronically Signed   By: 04/08/2016 M.D.   On: 02/13/2021 11:18  ____________________________________________   PROCEDURES  Procedure(s) performed (including Critical Care):  .Foreign Body Removal  Date/Time: 02/13/2021 1:11 PM Performed by: Gilles Chiquito, MD Authorized by: Gilles Chiquito, MD  Consent: Verbal consent obtained. Consent given by: patient Patient understanding: patient states understanding of the procedure being performed Patient identity confirmed: verbally with patient Body area: skin General location: trunk Location details: right axilla  Sedation: Patient sedated: no  Patient restrained: no Patient cooperative: yes Tendon involvement: none Depth: subcutaneous Complexity: simple 1 objects recovered. Objects recovered: iodoform packing Post-procedure assessment: foreign body removed Patient tolerance: patient tolerated the procedure well with no immediate complications    ____________________________________________   INITIAL IMPRESSION / ASSESSMENT AND PLAN / ED COURSE      Patient presents with above-stated history exam for assessment of 2 similar sleep-related concerns.  First she still having some cough and congestion after recent being diagnosed with bronchitis.  He is nursing subjective fevers but is also here with some persistent pain redness and swelling in her right axilla after recent I&D and packing for an  abscess and surrounding cellulitis.  She states he has been taking her Bactrim but has not removed or changed her dressing in several days since it was placed.  On arrival she is slight tachycardic with otherwise stable vital signs on room air.  She is afebrile.  Her lungs are clear bilaterally and her abdomen is soft nontender throughout.  Her oropharynx is unremarkable.  Chest x-ray has no focal consolidations effusion edema or other findings to suggest bacterial pneumonia.  I suspect likely a viral URI causing her cough and congestion.  She does not appear volume overloaded and have a low suspicion for CHF.  She appears to have ongoing cellulitis from recently I&D the abscess.  I suspect the primary reason for this is patient has had a dressing and packing in place for 4 days as once this was removed there was some purulent drainage.  She does not appear dehydrated on hypotensive or tachypneic or febrile and I have a lower suspicion for sepsis especially given her CBC shows no leukocytosis.  She was given analgesia and had resolution of her tachycardia which I suspect was related to pain in her axilla.  BMP shows no significant electrolyte metabolic derangements.  CBC otherwise unremarkable.  hCG is negative.  Suspect patient will likely have improvement now that retained packing has been removed.  Advised patient to keep the wound clean with warm soapy water twice a day and I will change her antibiotics to doxycycline.  Discussed returning immediately if she experiences any spreading of redness worsening pain numbness tingling or other associated symptoms in the axilla.  We will send a COVID and flu for I suspect is a viral bronchitis.  Advised her to follow-up with her PCP.  Discharged stable condition.  Strict return precautions advised and discussed.      ____________________________________________   FINAL CLINICAL IMPRESSION(S) / ED DIAGNOSES  Final diagnoses:  Axillary abscess  Cellulitis  of axillary fold    Medications  oxyCODONE-acetaminophen (PERCOCET/ROXICET) 5-325 MG per tablet 1 tablet (1 tablet Oral Given 02/13/21 1118)  ibuprofen (ADVIL) tablet 400 mg (400 mg Oral Given 02/13/21 1118)     ED Discharge Orders          Ordered    HYDROcodone-acetaminophen (NORCO) 5-325 MG tablet  3 times daily PRN        02/13/21 1308    doxycycline (VIBRAMYCIN) 100 MG capsule  2 times daily  02/13/21 1308             Note:  This document was prepared using Dragon voice recognition software and may include unintentional dictation errors.    Gilles Chiquito, MD 02/13/21 1315

## 2021-02-13 NOTE — ED Triage Notes (Addendum)
Pt to ED to get packing taken out of abscess to right arm. Reports fevers and sweats since being seen last. Pt ST in triage

## 2021-02-13 NOTE — ED Notes (Signed)
See triage note  presents with wound recheck  states she had abscess lanced couple of days ago  here to have packing removed

## 2021-09-29 ENCOUNTER — Encounter: Payer: Self-pay | Admitting: Obstetrics and Gynecology

## 2021-10-02 ENCOUNTER — Telehealth: Payer: Self-pay | Admitting: Obstetrics and Gynecology

## 2021-10-02 NOTE — Telephone Encounter (Signed)
Called patient to refer her to the [facility] [facility] [facility]. No answer left VM and [facility].

## 2021-10-24 ENCOUNTER — Encounter: Payer: Self-pay | Admitting: Obstetrics and Gynecology
